# Patient Record
Sex: Male | Born: 1990 | Race: White | Hispanic: No | Marital: Single | Smoking: Former smoker
Health system: Southern US, Community
[De-identification: ages and names within clinical notes are randomized; demographics above are authoritative.]

## PROBLEM LIST (undated history)

## (undated) HISTORY — PX: CYSTECTOMY: SUR359

---

## 2015-05-10 ENCOUNTER — Other Ambulatory Visit: Payer: Self-pay | Admitting: Internal Medicine

## 2015-05-10 DIAGNOSIS — N50812 Left testicular pain: Secondary | ICD-10-CM

## 2015-05-12 ENCOUNTER — Ambulatory Visit
Admission: RE | Admit: 2015-05-12 | Discharge: 2015-05-12 | Disposition: A | Discharge status: Home / Self Care | Payer: BLUE CROSS/BLUE SHIELD | Source: Ambulatory Visit | Attending: Internal Medicine | Admitting: Internal Medicine

## 2015-05-12 DIAGNOSIS — N508 Other specified disorders of male genital organs: Secondary | ICD-10-CM | POA: Diagnosis present

## 2015-05-12 DIAGNOSIS — N50812 Left testicular pain: Secondary | ICD-10-CM

## 2015-06-20 ENCOUNTER — Encounter: Payer: Self-pay | Admitting: *Deleted

## 2015-06-21 ENCOUNTER — Ambulatory Visit: Payer: BLUE CROSS/BLUE SHIELD | Admitting: Anesthesiology

## 2015-06-21 ENCOUNTER — Ambulatory Visit
Admission: RE | Admit: 2015-06-21 | Discharge: 2015-06-21 | Disposition: A | Discharge status: Home / Self Care | Payer: BLUE CROSS/BLUE SHIELD | Source: Ambulatory Visit | Attending: Gastroenterology | Admitting: Gastroenterology

## 2015-06-21 ENCOUNTER — Encounter
Admission: RE | Disposition: A | Discharge status: Home / Self Care | Payer: Self-pay | Source: Ambulatory Visit | Attending: Gastroenterology

## 2015-06-21 ENCOUNTER — Encounter: Payer: Self-pay | Admitting: Anesthesiology

## 2015-06-21 DIAGNOSIS — K210 Gastro-esophageal reflux disease with esophagitis: Secondary | ICD-10-CM | POA: Diagnosis not present

## 2015-06-21 DIAGNOSIS — Z8249 Family history of ischemic heart disease and other diseases of the circulatory system: Secondary | ICD-10-CM | POA: Diagnosis not present

## 2015-06-21 DIAGNOSIS — R197 Diarrhea, unspecified: Secondary | ICD-10-CM | POA: Insufficient documentation

## 2015-06-21 DIAGNOSIS — K2900 Acute gastritis without bleeding: Secondary | ICD-10-CM | POA: Insufficient documentation

## 2015-06-21 DIAGNOSIS — R194 Change in bowel habit: Secondary | ICD-10-CM | POA: Diagnosis not present

## 2015-06-21 DIAGNOSIS — K259 Gastric ulcer, unspecified as acute or chronic, without hemorrhage or perforation: Secondary | ICD-10-CM | POA: Insufficient documentation

## 2015-06-21 DIAGNOSIS — R1030 Lower abdominal pain, unspecified: Secondary | ICD-10-CM | POA: Diagnosis present

## 2015-06-21 DIAGNOSIS — R1013 Epigastric pain: Secondary | ICD-10-CM | POA: Diagnosis not present

## 2015-06-21 DIAGNOSIS — K760 Fatty (change of) liver, not elsewhere classified: Secondary | ICD-10-CM | POA: Insufficient documentation

## 2015-06-21 DIAGNOSIS — K449 Diaphragmatic hernia without obstruction or gangrene: Secondary | ICD-10-CM | POA: Diagnosis not present

## 2015-06-21 DIAGNOSIS — Z79899 Other long term (current) drug therapy: Secondary | ICD-10-CM | POA: Diagnosis not present

## 2015-06-21 DIAGNOSIS — Z87891 Personal history of nicotine dependence: Secondary | ICD-10-CM | POA: Insufficient documentation

## 2015-06-21 DIAGNOSIS — Z803 Family history of malignant neoplasm of breast: Secondary | ICD-10-CM | POA: Insufficient documentation

## 2015-06-21 HISTORY — PX: COLONOSCOPY WITH PROPOFOL: SHX5780

## 2015-06-21 HISTORY — PX: ESOPHAGOGASTRODUODENOSCOPY (EGD) WITH PROPOFOL: SHX5813

## 2015-06-21 SURGERY — ESOPHAGOGASTRODUODENOSCOPY (EGD) WITH PROPOFOL
Anesthesia: General

## 2015-06-21 MED ORDER — PROPOFOL INFUSION 10 MG/ML OPTIME
INTRAVENOUS | Status: DC | PRN
  Administered 2015-06-21: 180 ug/kg/min via INTRAVENOUS
  Administered 2015-06-21: 12:00:00 via INTRAVENOUS

## 2015-06-21 MED ORDER — SODIUM CHLORIDE 0.9 % IV SOLN: INTRAVENOUS | Status: DC

## 2015-06-21 MED ORDER — IPRATROPIUM-ALBUTEROL 0.5-2.5 (3) MG/3ML IN SOLN
RESPIRATORY_TRACT | Status: AC
  Administered 2015-06-21: 3 mL via RESPIRATORY_TRACT
  Filled 2015-06-21: qty 3

## 2015-06-21 MED ORDER — MIDAZOLAM HCL 5 MG/5ML IJ SOLN
INTRAMUSCULAR | Status: DC | PRN
  Administered 2015-06-21: 2 mg via INTRAVENOUS

## 2015-06-21 MED ORDER — SODIUM CHLORIDE 0.9 % IV SOLN
INTRAVENOUS | Status: DC
  Administered 2015-06-21: 11:00:00 via INTRAVENOUS
  Administered 2015-06-21: 1000 mL via INTRAVENOUS

## 2015-06-21 MED ORDER — PROPOFOL 10 MG/ML IV BOLUS
INTRAVENOUS | Status: DC | PRN
  Administered 2015-06-21: 60 mg via INTRAVENOUS
  Administered 2015-06-21: 40 mg via INTRAVENOUS

## 2015-06-21 MED ORDER — IPRATROPIUM-ALBUTEROL 0.5-2.5 (3) MG/3ML IN SOLN
3.0000 mL | Freq: Four times a day (QID) | RESPIRATORY_TRACT | Status: DC
  Administered 2015-06-21: 3 mL via RESPIRATORY_TRACT

## 2015-06-21 MED ORDER — FENTANYL CITRATE (PF) 100 MCG/2ML IJ SOLN
INTRAMUSCULAR | Status: DC | PRN
  Administered 2015-06-21: 50 ug via INTRAVENOUS

## 2015-06-21 MED ORDER — LIDOCAINE HCL (CARDIAC) 20 MG/ML IV SOLN: INTRAVENOUS | Status: DC | PRN

## 2015-06-21 MED ORDER — GLYCOPYRROLATE 0.2 MG/ML IJ SOLN
INTRAMUSCULAR | Status: DC | PRN
  Administered 2015-06-21: .2 mg via INTRAVENOUS

## 2015-06-21 MED ORDER — LIDOCAINE HCL (CARDIAC) 20 MG/ML IV SOLN
INTRAVENOUS | Status: DC | PRN
  Administered 2015-06-21: 100 mg via INTRAVENOUS

## 2015-06-21 NOTE — H&P (Signed)
Outpatient short stay form Pre-procedure 06/21/2015 11:05 AM Christena DeemMartin U Marks Scalera MD  Primary Physician: Dr Cora DanielsV Hande  Reason for visit:  EGD and colonoscopy  History of present illness:  Patient is a 24 year old male setting today for EGD and colonoscopy. Several weeks ago began have GI symptoms of epigastric and lower abdominal pain change of bowel habits with diarrhea. Also been having increase in dyspepsia although he has been taking omeprazole. He had a CT scan done on a [facility]. This procedure was done on 05/10/2015. There was some evidence of some mild wall thickening of the distal colon and rectum without mesenteric stranding. There was some mild hepatic steatosis and some stool noted in the terminal ileum. He tolerated his prep well. He is not currently taking any aspirin or anti- coagulates.    Current facility-administered medications:  .  0.9 %  sodium chloride infusion, , Intravenous, Continuous, Christena DeemMartin U Brittian Renaldo, MD, Last Rate: 20 mL/hr at 06/21/15 1005, 1,000 mL at 06/21/15 1005 .  0.9 %  sodium chloride infusion, , Intravenous, Continuous, Christena DeemMartin U Amarii Amy, MD .  0.9 %  sodium chloride infusion, , Intravenous, Continuous, Christena DeemMartin U Antwane Grose, MD .  ipratropium-albuterol (DUONEB) 0.5-2.5 (3) MG/3ML nebulizer solution 3 mL, 3 mL, Nebulization, Q6H, Rosaria FerriesJoseph K Piscitello, MD, 3 mL at 06/21/15 1044  Prescriptions prior to admission  Medication Sig Dispense Refill Last Dose  . amphetamine-dextroamphetamine (ADDERALL XR) 10 MG 24 hr capsule Take 10 mg by mouth daily.   06/14/2015  . omeprazole (PRILOSEC) 20 MG capsule Take 20 mg by mouth daily.   06/14/2015     No Known Allergies   History reviewed. No pertinent past medical history.  Review of systems:      Physical Exam    Heart and lungs: Regular rate and rhythm without rub or gallop lungs bilaterally clear    HEENT: Normocephalic atraumatic eyes are anicteric    Other: Soft nontender nondistended  bowel sounds positive normoactive    Pertinant exam for procedure: Above    Planned proceedures: EGD and colonoscopy. I have discussed the risks benefits and complications of procedures to include not limited to bleeding, infection, perforation and the risk of sedation and the patient wishes to proceed.    Christena DeemMartin U Jarrod Bodkins, MD Gastroenterology 06/21/2015  11:05 AM

## 2015-06-21 NOTE — Op Note (Signed)
[facility] Gastroenterology Patient Name: Sean Kline Procedure Date: 06/21/2015 10:57 AM MRN: [id number] Account #: [id number] Date of Birth: 05-01-1991 Admit Type: Outpatient Age: 24 Room: [facility] ENDO ROOM 3 Gender: Male Note Status: Finalized Procedure:         Upper GI endoscopy Indications:       Epigastric abdominal pain, Dyspepsia Providers:         Christena Deem, MD Referring MD:      Barbette Reichmann, MD (Referring MD) Medicines:         Monitored Anesthesia Care Complications:     No immediate complications. Procedure:         Pre-Anesthesia Assessment:                    - ASA Grade Assessment: II - A patient with mild systemic                     disease.                    After obtaining informed consent, the endoscope was passed                     under direct vision. Throughout the procedure, the                     patient's blood pressure, pulse, and oxygen saturations                     were monitored continuously. The Endoscope was introduced                     through the mouth, and advanced to the fourth part of                     duodenum. The upper GI endoscopy was accomplished without                     difficulty. The patient tolerated the procedure well. Findings:      LA Grade C (one or more mucosal breaks continuous between tops of 2 or       more mucosal folds, less than 75% circumference) esophagitis with       friable mucosa was found. Biopsies were taken with a cold forceps for       histology.      A small hiatus hernia was present.      The exam of the esophagus was otherwise normal.      Diffuse mild inflammation characterized by congestion (edema), erosions       and erythema was found in the gastric body. Biopsies were taken with a       cold forceps for histology.      Patchy mild inflammation characterized by erosions and erythema was       found in the gastric antrum and in the prepyloric region of the  stomach.       Biopsies were taken with a cold forceps for histology.      The cardia and gastric fundus were normal on retroflexion otherwise.      Diffuse mucosal flattening was found in the entire examined duodenum. Impression:        - LA Grade C erosive esophagitis. Biopsied.                    -  Small hiatus hernia.                    - Erosive gastritis. Biopsied.                    - Erosive gastritis. Biopsied. Recommendation:    - Use Protonix (pantoprazole) 40 mg PO BID for 3 weeks.                    - Use Protonix (pantoprazole) 40 mg PO daily daily.                    - Await pathology results.                    - Perform a colonoscopy today. Procedure Code(s): --- Professional ---                    43239, Esophagogastroduodenoscopy, flexible, transoral;         [phone]            with biopsy, single or multiple Diagnosis Code(s): --- Professional ---                    530.19, Other esophagitis                    535.40, Other specified gastritis, without mention of                     hemorrhage                    789.06, Abdominal pain, epigastric                    536.8, Dyspepsia and other specified disorders of function                     of stomach                    553.3, Diaphragmatic hernia without mention of obstruction                     or gangrene CPT copyright 2014 American Medical Association. All rights reserved. The codes documented in this report are preliminary and upon coder review may  be revised to meet current compliance requirements. Christena DeemMartin U Skulskie, MD 06/21/2015 11:37:19 AM This report has been signed electronically. Number of Addenda: 0 Note Initiated On: 06/21/2015 10:57 AM      [facility]

## 2015-06-21 NOTE — Transfer of Care (Signed)
Immediate Anesthesia Transfer of Care Note  Patient: Sean Kline  Procedure(s) Performed: Procedure(s): ESOPHAGOGASTRODUODENOSCOPY (EGD) WITH PROPOFOL (N/A) COLONOSCOPY WITH PROPOFOL (N/A)  Patient Location: PACU  Anesthesia Type:General  Level of Consciousness: sedated  Airway & Oxygen Therapy: Patient Spontanous Breathing and Patient connected to nasal cannula oxygen  Post-op Assessment: Report given to RN and Post -op Vital signs reviewed and stable  Post vital signs: Reviewed and stable  Last Vitals:  Filed Vitals:   06/21/15 1201  BP: 101/50  Pulse: 76  Temp: 36.6 C  Resp: 15    Complications: No apparent anesthesia complications

## 2015-06-21 NOTE — Anesthesia Preprocedure Evaluation (Signed)
Anesthesia Evaluation  Patient identified by MRN, date of birth, ID band Patient awake    Reviewed: Allergy & Precautions, H&P , NPO status , Patient's Chart, lab work & pertinent test results, reviewed documented beta blocker date and time   Airway Mallampati: II  TM Distance: >3 FB Neck ROM: full    Dental no notable dental hx. (+) Teeth Intact   Pulmonary asthma , former smoker,  breath sounds clear to auscultation  Pulmonary exam normal       Cardiovascular negative cardio ROS Normal cardiovascular examRhythm:regular Rate:Normal     Neuro/Psych negative neurological ROS  negative psych ROS   GI/Hepatic negative GI ROS, Neg liver ROS,   Endo/Other  negative endocrine ROS  Renal/GU negative Renal ROS  negative genitourinary   Musculoskeletal   Abdominal   Peds  Hematology negative hematology ROS (+)   Anesthesia Other Findings   Reproductive/Obstetrics negative OB ROS                             Anesthesia Physical Anesthesia Plan  ASA: III  Anesthesia Plan: General   Post-op Pain Management:    Induction:   Airway Management Planned:   Additional Equipment:   Intra-op Plan:   Post-operative Plan:   Informed Consent: I have reviewed the patients History and Physical, chart, labs and discussed the procedure including the risks, benefits and alternatives for the proposed anesthesia with the patient or authorized representative who has indicated his/her understanding and acceptance.   Dental Advisory Given  Plan Discussed with: Anesthesiologist, CRNA and Surgeon  Anesthesia Plan Comments:         Anesthesia Quick Evaluation

## 2015-06-21 NOTE — Anesthesia Procedure Notes (Signed)
Date/Time: 06/21/2015 11:31 AM Performed by: Henrietta HooverPOPE, Adaisha Campise Pre-anesthesia Checklist: Patient identified, Emergency Drugs available, Suction available, Patient being monitored and Timeout performed Patient Re-evaluated:Patient Re-evaluated prior to inductionOxygen Delivery Method: Nasal cannula Intubation Type: IV induction

## 2015-06-21 NOTE — Op Note (Signed)
[facility] Gastroenterology Patient Name: Sean Kline Procedure Date: 06/21/2015 10:55 AM MRN: [id number] Account #: [id number] Date of Birth: Nov 08, 1991 Admit Type: Outpatient Age: 24 Room: [facility] ENDO ROOM 3 Gender: Male Note Status: Finalized Procedure:         Colonoscopy Indications:       Lower abdominal pain, Change in bowel habits, Diarrhea Providers:         Christena Deem, MD Referring MD:      Barbette Reichmann, MD (Referring MD) Medicines:         Monitored Anesthesia Care Complications:     No immediate complications. Procedure:         Pre-Anesthesia Assessment:                    - ASA Grade Assessment: II - A patient with mild systemic                     disease.                    After obtaining informed consent, the colonoscope was                     passed under direct vision. Throughout the procedure, the                     patient's blood pressure, pulse, and oxygen saturations                     were monitored continuously. The Olympus PCF-H180AL                     colonoscope ( S#: [id number] ) was introduced through the                     anus and advanced to the the terminal ileum. The patient                     tolerated the procedure well. The colonoscopy was                     performed without difficulty. Findings:      The colon (entire examined portion) appeared normal. There was a minimal       erythema in the left colon.      Biopsies for histology were taken with a cold forceps from the right       colon and left colon for evaluation of microscopic colitis.      The retroflexed view of the distal rectum and anal verge was normal and       showed no anal or rectal abnormalities.      The digital rectal exam was normal.      The terminal ileum appeared normal. Impression:        - The entire examined colon is normal.                    - The distal rectum and anal verge are normal on   retroflexion view.                    - Biopsies were taken with a cold forceps from the right                     colon and left colon for  evaluation of microscopic colitis. Recommendation:    - Await pathology results.                    - trial [employer] colon health one capsule daily for one                     month Procedure Code(s): --- Professional ---                    [id number], Colonoscopy, flexible; with biopsy, single or                     multiple Diagnosis Code(s): --- Professional ---                    789.09, Abdominal pain, other specified site                    787.99, Other symptoms involving digestive system                    787.91, Diarrhea CPT copyright 2014 American Medical Association. All rights reserved. The codes documented in this report are preliminary and upon coder review may  be revised to meet current compliance requirements. Christena DeemMartin U Skulskie, MD 06/21/2015 11:56:56 AM This report has been signed electronically. Number of Addenda: 0 Note Initiated On: 06/21/2015 10:55 AM Scope Withdrawal Time: 0 hours 7 minutes 23 seconds  Total Procedure Duration: 0 hours 14 minutes 4 seconds       [facility]

## 2015-06-21 NOTE — Anesthesia Postprocedure Evaluation (Signed)
  Anesthesia Post-op Note  Patient: Sean Kline  Procedure(s) Performed: Procedure(s): ESOPHAGOGASTRODUODENOSCOPY (EGD) WITH PROPOFOL (N/A) COLONOSCOPY WITH PROPOFOL (N/A)  Anesthesia type:General  Patient location: PACU  Post pain: Pain level controlled  Post assessment: Post-op Vital signs reviewed, Patient's Cardiovascular Status Stable, Respiratory Function Stable, Patent Airway and No signs of Nausea or vomiting  Post vital signs: Reviewed and stable  Last Vitals:  Filed Vitals:   06/21/15 1231  BP: 118/61  Pulse: 81  Temp:   Resp: 16    Level of consciousness: awake, alert  and patient cooperative  Complications: No apparent anesthesia complications

## 2015-06-23 LAB — SURGICAL PATHOLOGY

## 2015-06-27 ENCOUNTER — Encounter: Payer: Self-pay | Admitting: Gastroenterology

## 2016-01-18 IMAGING — US US SCROTUM
1 series · 14 of 25 positions shown · non-contrast
Comparison: None.

CLINICAL DATA: Left testicular pain.

EXAM:
SCROTAL ULTRASOUND
DOPPLER ULTRASOUND OF THE TESTICLES
TECHNIQUE: Complete ultrasound examination of the testicles, epididymis, and
other scrotal structures was performed. Color and spectral Doppler
ultrasound were also utilized to evaluate blood flow to the
testicles.

[Series 1: us scrotum · 0.08mm/px · 14 of 98 slices shown]
[im 1/98]
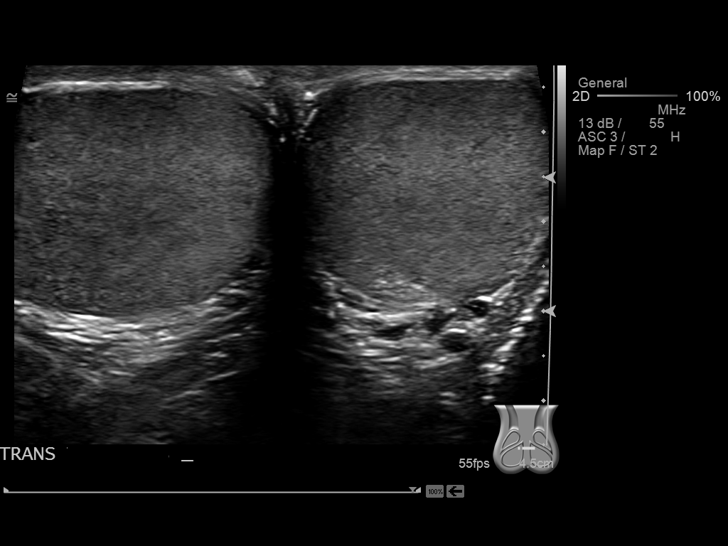
[im 9/98]
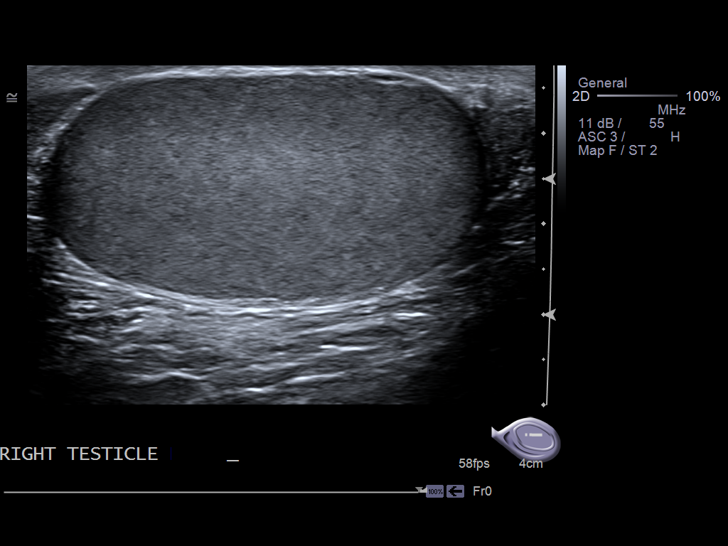
[im 17/98]
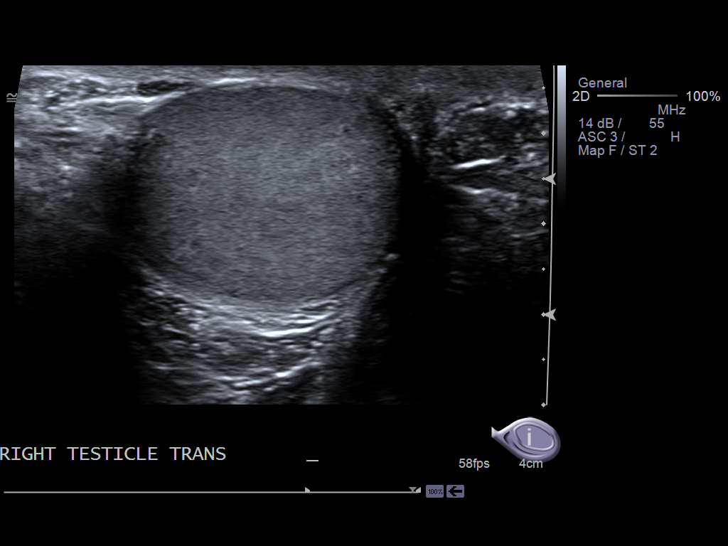
[im 25/98]
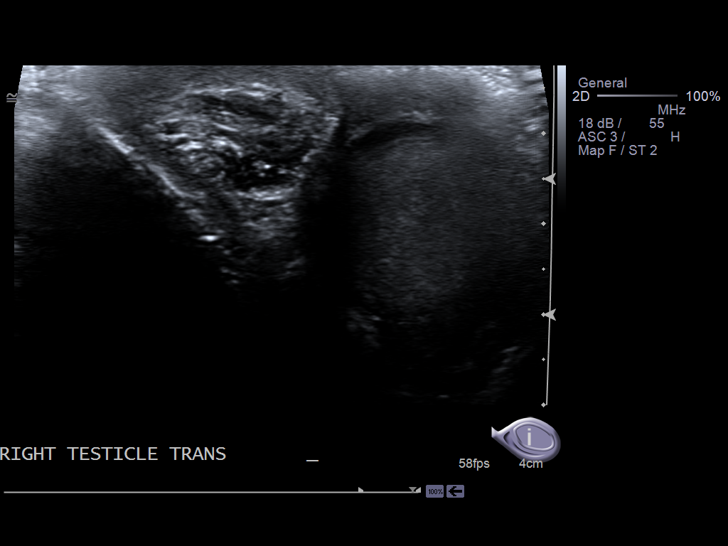
[im 33/98]
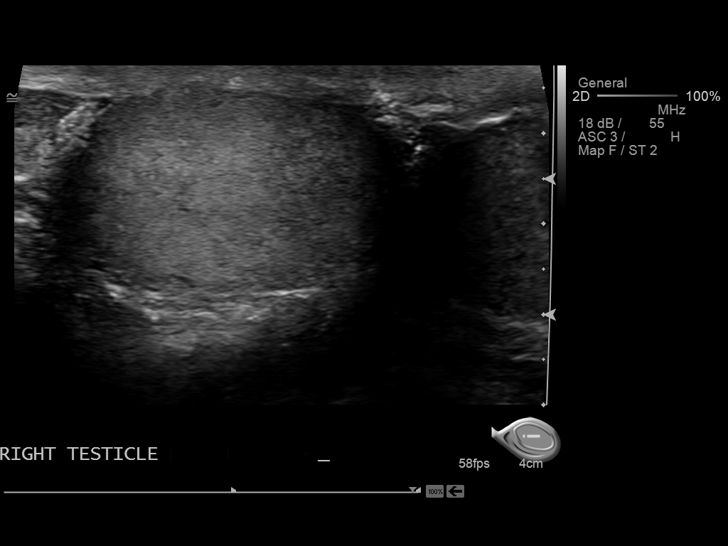
[im 37/98]
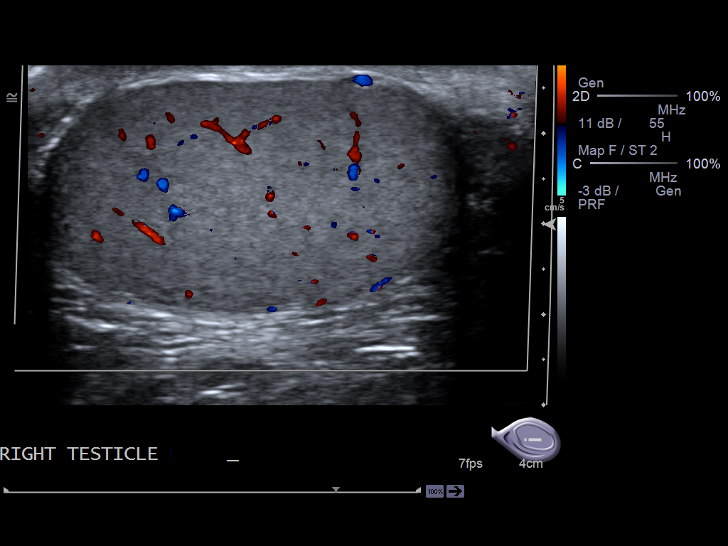
[im 45/98]
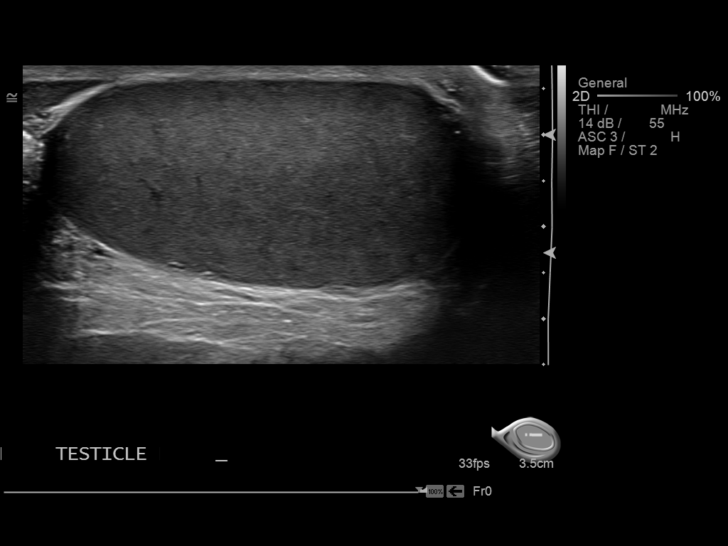
[im 53/98]
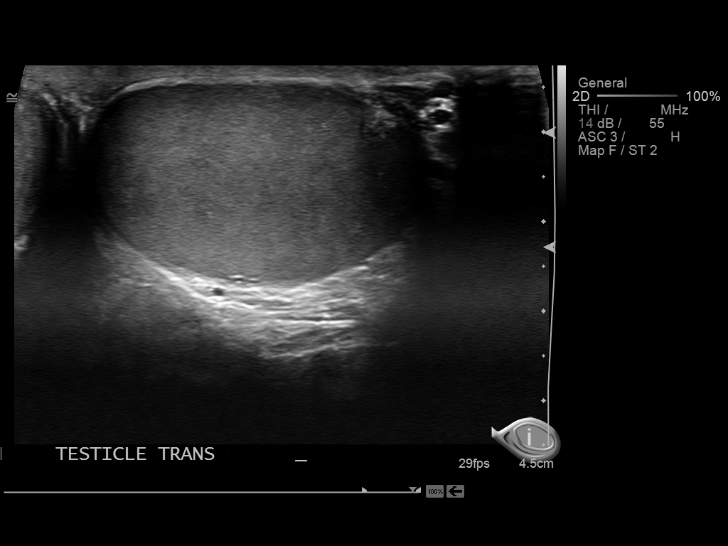
[im 61/98]
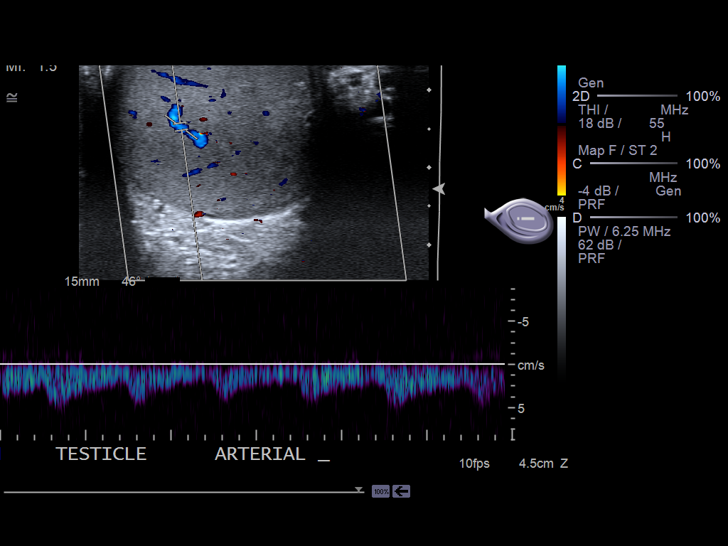
[im 65/98]
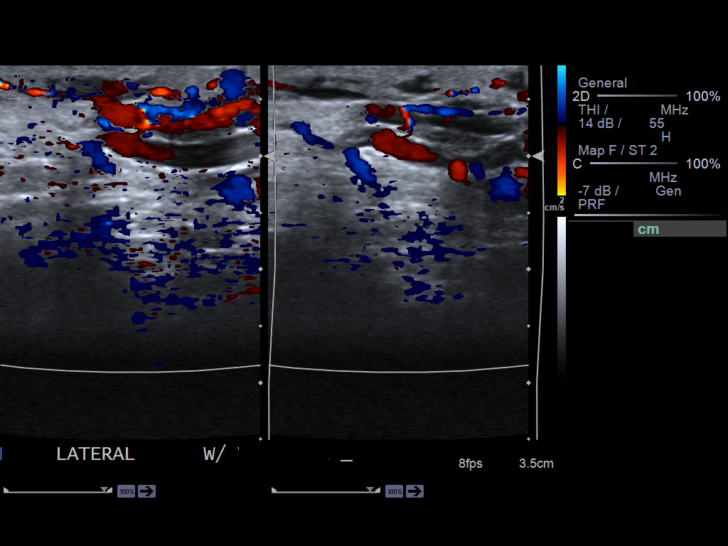
[im 73/98]
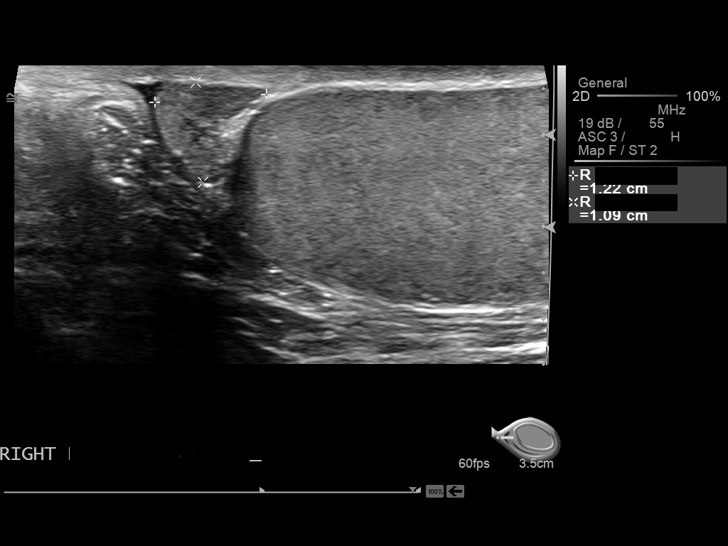
[im 81/98]
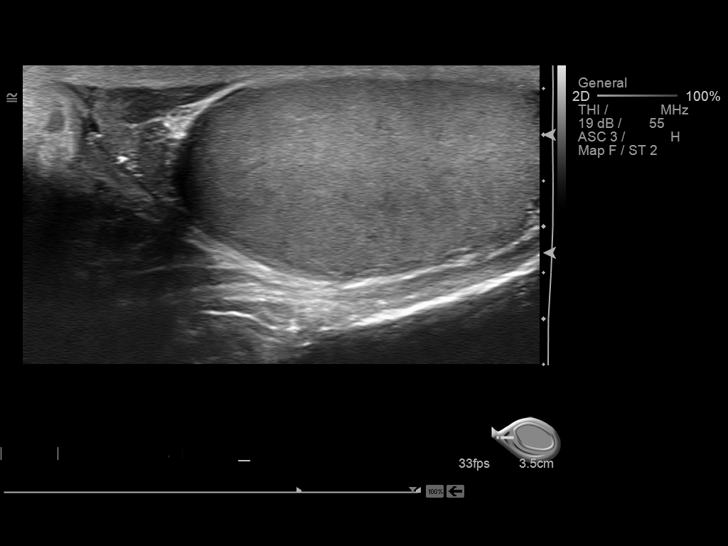
[im 89/98]
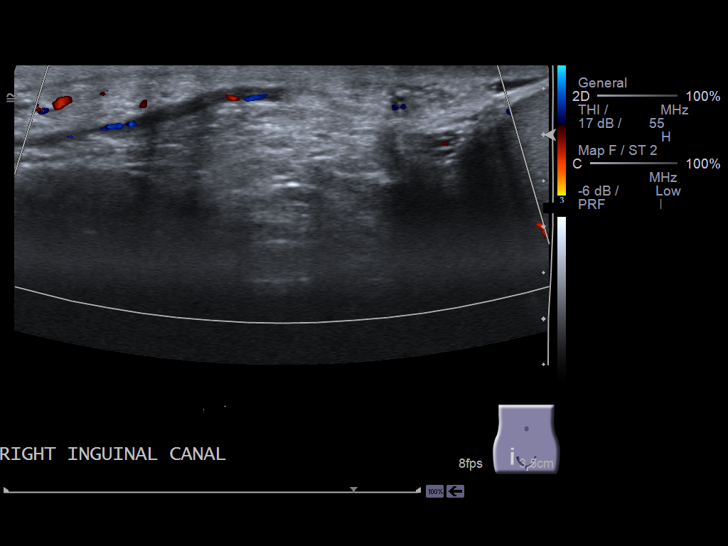
[im 98/98]
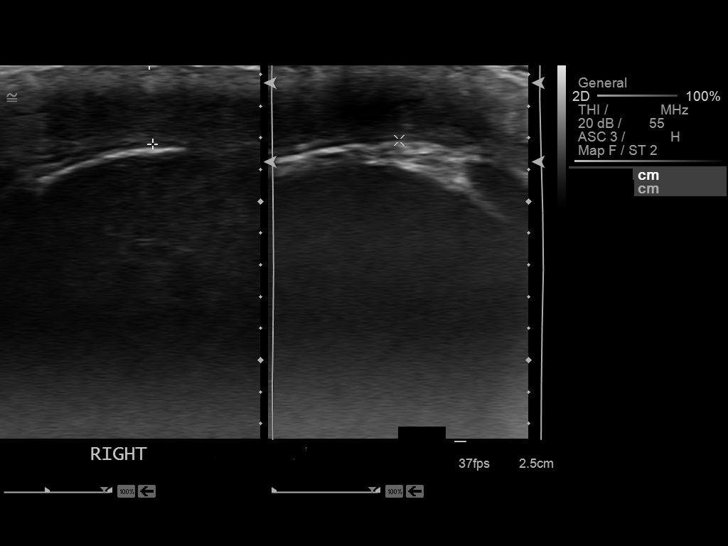

[14 of 25 positions shown; findings below may reference images not displayed]

FINDINGS: Right testicle

Measurements: 4.8 x 2.5 x 3.2 cm. No mass or microlithiasis
visualized.

Left testicle

Measurements: 4.8 x 2.3 x 3.1 cm. No mass or microlithiasis
visualized.

Right epididymis:  Normal in size and appearance.

Left epididymis:  Normal in size and appearance.

Hydrocele:  Small left hydrocele.

Varicocele:  None visualized.

Pulsed Doppler interrogation of both testes demonstrates normal low
resistance arterial and venous waveforms bilaterally.
IMPRESSION: Small left hydrocele, otherwise normal exam. No evidence of
testicular mass or torsion.

## 2017-10-04 ENCOUNTER — Ambulatory Visit
Admission: RE | Admit: 2017-10-04 | Discharge: 2017-10-04 | Disposition: A | Discharge status: Home / Self Care | Payer: Managed Care, Other (non HMO) | Source: Ambulatory Visit | Attending: Gastroenterology | Admitting: Gastroenterology

## 2017-10-04 ENCOUNTER — Other Ambulatory Visit: Payer: Self-pay | Admitting: Gastroenterology

## 2017-10-04 DIAGNOSIS — K5900 Constipation, unspecified: Secondary | ICD-10-CM | POA: Diagnosis present

## 2017-10-04 DIAGNOSIS — R1012 Left upper quadrant pain: Secondary | ICD-10-CM | POA: Diagnosis present

## 2017-10-04 DIAGNOSIS — R195 Other fecal abnormalities: Secondary | ICD-10-CM | POA: Diagnosis not present

## 2017-11-14 ENCOUNTER — Other Ambulatory Visit: Payer: Self-pay | Admitting: Gastroenterology

## 2017-11-14 DIAGNOSIS — R1084 Generalized abdominal pain: Secondary | ICD-10-CM

## 2017-11-14 DIAGNOSIS — R110 Nausea: Secondary | ICD-10-CM

## 2017-11-27 ENCOUNTER — Ambulatory Visit: Payer: Managed Care, Other (non HMO)

## 2017-11-27 ENCOUNTER — Other Ambulatory Visit: Payer: Managed Care, Other (non HMO)

## 2018-06-18 ENCOUNTER — Other Ambulatory Visit
Admission: RE | Admit: 2018-06-18 | Discharge: 2018-06-18 | Disposition: A | Discharge status: Home / Self Care | Payer: Managed Care, Other (non HMO) | Source: Ambulatory Visit | Attending: Internal Medicine | Admitting: Internal Medicine

## 2018-06-18 DIAGNOSIS — R079 Chest pain, unspecified: Secondary | ICD-10-CM | POA: Insufficient documentation

## 2018-06-18 DIAGNOSIS — F410 Panic disorder [episodic paroxysmal anxiety] without agoraphobia: Secondary | ICD-10-CM | POA: Insufficient documentation

## 2018-06-18 DIAGNOSIS — F430 Acute stress reaction: Secondary | ICD-10-CM | POA: Insufficient documentation

## 2018-06-18 LAB — CKMB (ARMC ONLY): CK, MB: 4.6 ng/mL (ref 0.5–5.0)

## 2018-06-18 LAB — TROPONIN I: Troponin I: <0.03 ng/mL (ref <0.03)

## 2018-06-18 LAB — CK: CK TOTAL: 149 U/L (ref 49–397)

## 2018-07-17 IMAGING — CR DG ABDOMEN 2V
1 series · 3 of 3 positions shown · non-contrast
Comparison: None.

CLINICAL DATA: Acute left upper quadrant abdominal pain,
constipation.

EXAM:
ABDOMEN - 2 VIEW

[Series 1: dg abd 2 views · 0.14mm/px · 3 of 3 slices shown]
[im 1/3]
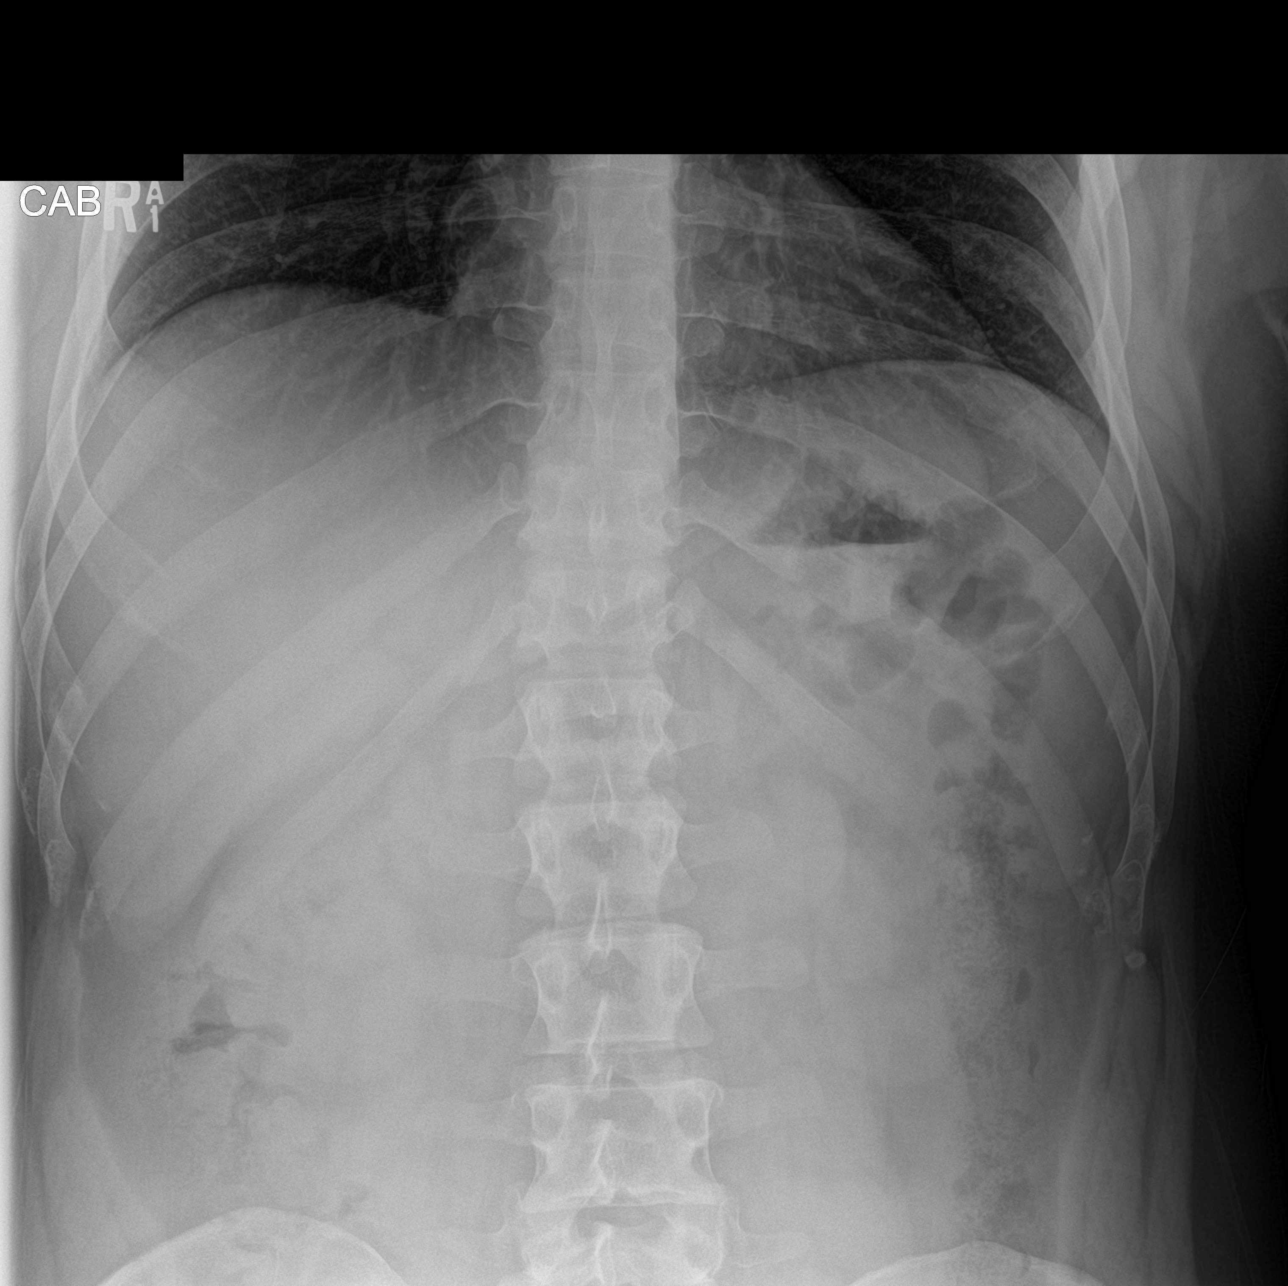
[im 2/3]
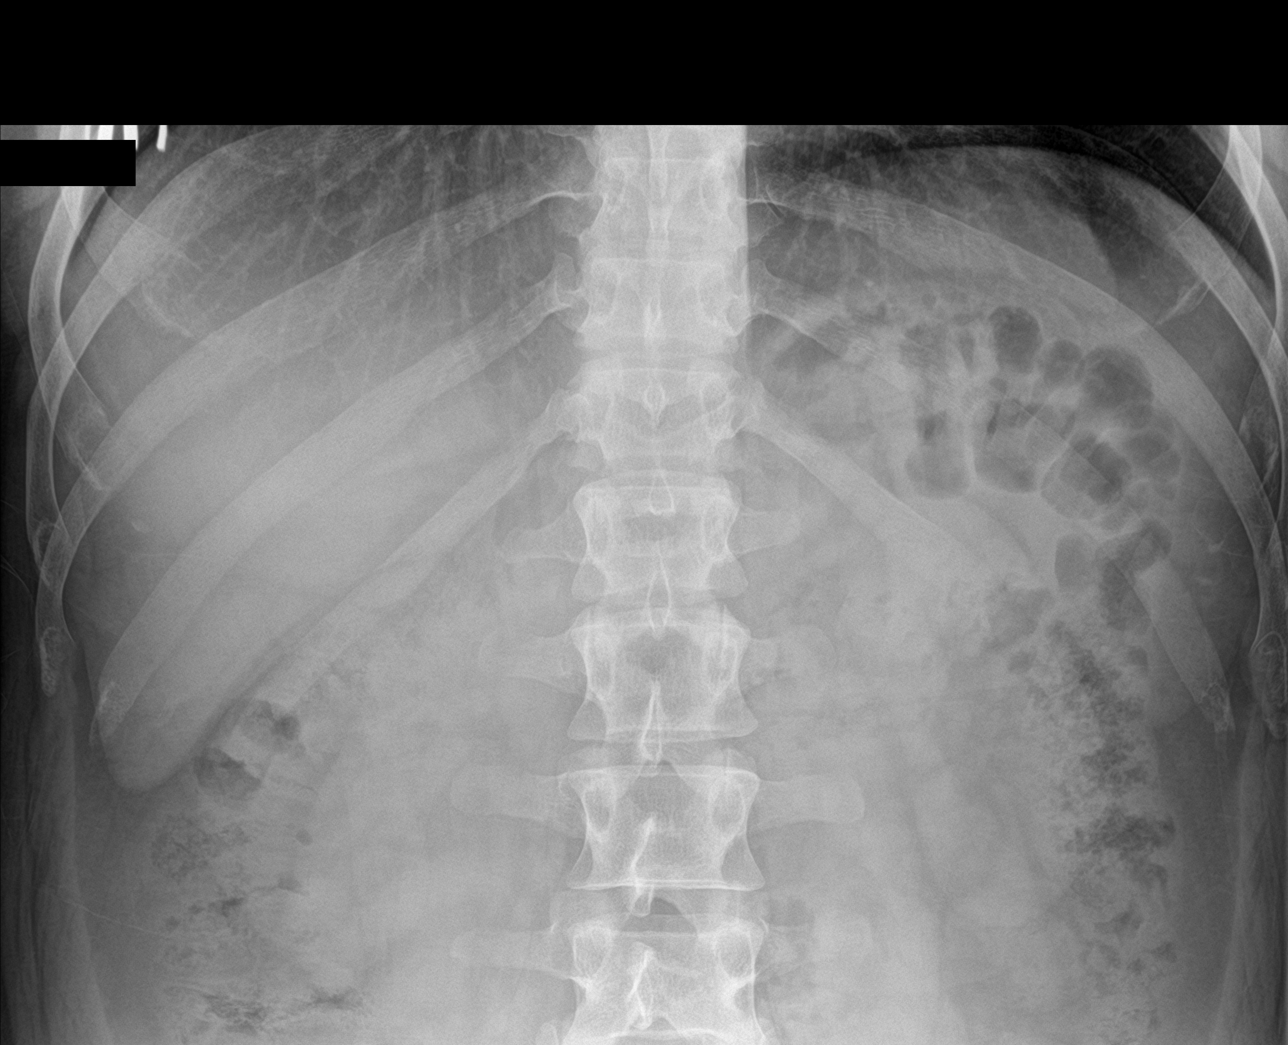
[im 3/3]
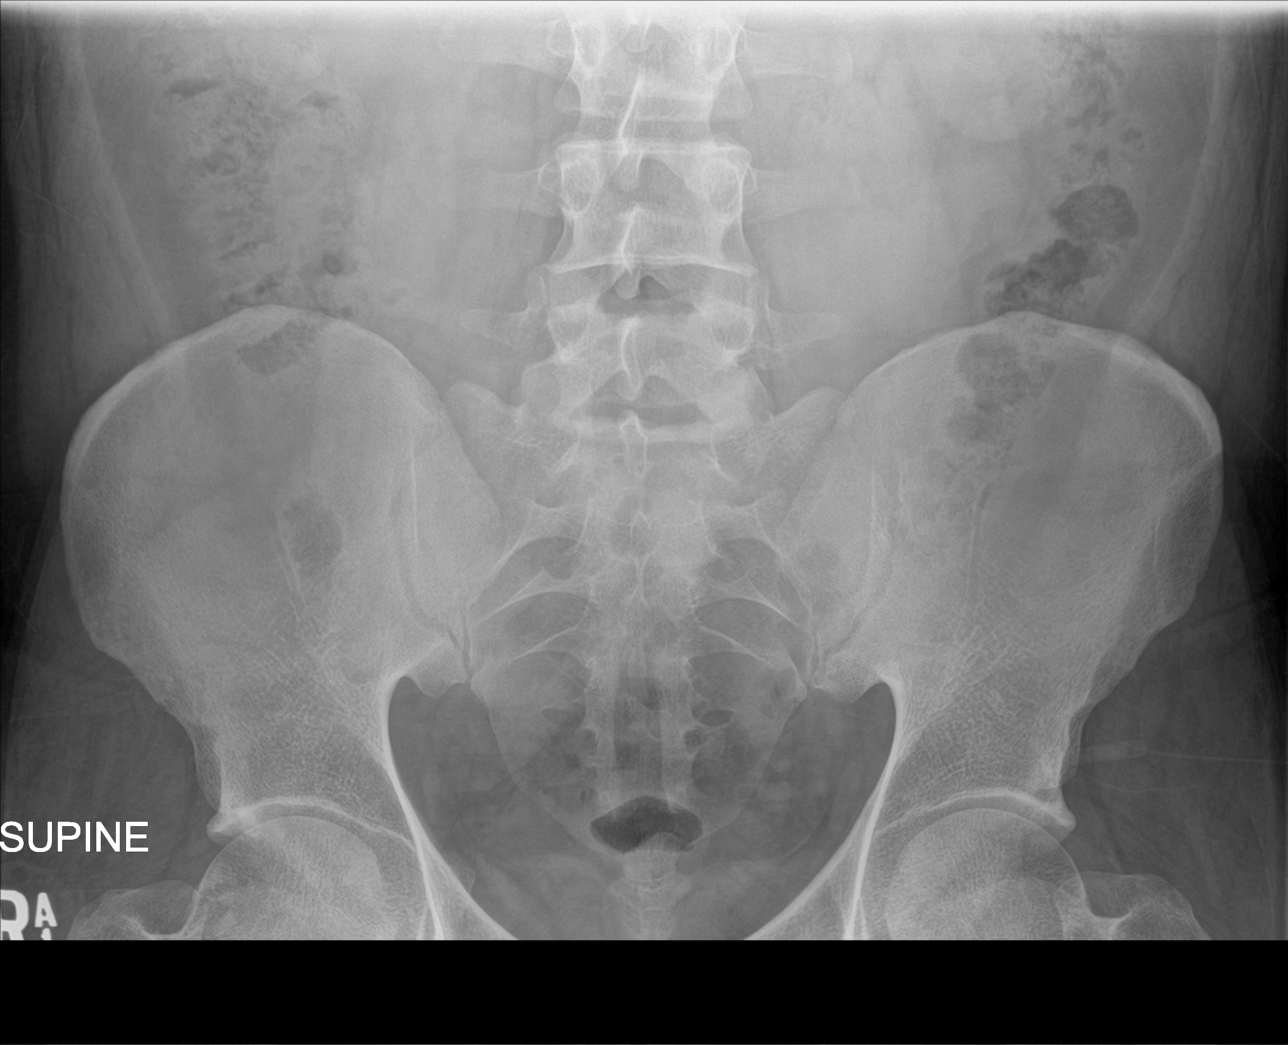

[3 of 3 positions shown; findings below may reference images not displayed]

FINDINGS: The bowel gas pattern is normal. There is no evidence of free air.
Moderate amount of stool is seen throughout the colon. No
radio-opaque calculi or other significant radiographic abnormality
is seen.
IMPRESSION: Moderate stool burden.  No evidence of bowel obstruction or ileus.
